# Patient Record
Sex: Female | Born: 1985 | Race: White | Hispanic: No | Marital: Married | Smoking: Never smoker
Health system: Southern US, Community
[De-identification: ages and names within clinical notes are randomized; demographics above are authoritative.]

## PROBLEM LIST (undated history)

## (undated) DIAGNOSIS — D649 Anemia, unspecified: Secondary | ICD-10-CM

## (undated) DIAGNOSIS — F32A Depression, unspecified: Secondary | ICD-10-CM

## (undated) DIAGNOSIS — E039 Hypothyroidism, unspecified: Secondary | ICD-10-CM

---

## 2017-09-26 ENCOUNTER — Ambulatory Visit: Payer: Self-pay | Admitting: Physician Assistant

## 2019-04-05 NOTE — L&D Delivery Note (Signed)
Operative Delivery Note At 7:07 PM a viable and healthy female was delivered via Vaginal, [employer], operational).  Presentation: vertex; Position: Left,, Occiput,, Anterior; Station: +4.  Verbal consent: obtained from patient.  Risks and benefits discussed in detail.  Risks include, but are not limited to the risks of anesthesia, bleeding, infection, damage to maternal tissues, fetal cephalhematoma.  There is also the risk of inability to effect vaginal delivery of the head, or shoulder dystocia that cannot be resolved by established maneuvers, leading to the need for emergency cesarean section.  APGAR: 7, 8; weight  pending.   Placenta status:spontaneous ,intact .   Cord:  with the following complications:  none.  Cord pH: na  Anesthesia:  epidural Instruments: kiwi x 2 pulls Episiotomy: None Lacerations: 2nd degree Suture Repair: 2.0 vicryl rapide Est. Blood Loss (mL):  400  Mom to postpartum.  Baby to Couplet care / Skin to Skin.  Anzal Bartnick J 03/05/2020, 7:22 PM

## 2019-06-09 ENCOUNTER — Ambulatory Visit: Payer: Managed Care, Other (non HMO) | Attending: Internal Medicine

## 2019-06-09 DIAGNOSIS — Z23 Encounter for immunization: Secondary | ICD-10-CM | POA: Insufficient documentation

## 2019-06-09 NOTE — Progress Notes (Signed)
   Covid-19 Vaccination Clinic  Name:  Brandy Glover    MRN: [id number] DOB: 1986-02-27  06/09/2019  [name] was observed post Covid-19 immunization for 15 minutes without incident. She was provided with Vaccine Information Sheet and instruction to access the V-Safe system.   [name] was instructed to call 911 with any severe reactions post vaccine: Marland Kitchen Difficulty breathing  . Swelling of face and throat  . A fast heartbeat  . A bad rash all over body  . Dizziness and weakness   Immunizations Administered    Name Date Dose VIS Date Route   Pfizer COVID-19 Vaccine 06/09/2019  3:24 PM 0.3 mL 03/15/2019 Intramuscular   Manufacturer: [employer], [employer]   Lot: [id number]   NDC: [address]-6

## 2019-07-10 ENCOUNTER — Ambulatory Visit: Payer: Managed Care, Other (non HMO) | Attending: Internal Medicine

## 2019-07-10 DIAGNOSIS — Z23 Encounter for immunization: Secondary | ICD-10-CM

## 2019-07-10 NOTE — Progress Notes (Signed)
   Covid-19 Vaccination Clinic  Name:  Brandy Glover    MRN: [id number] DOB: 1986-02-28  07/10/2019  Ms. Trim was observed post Covid-19 immunization for 15 minutes without incident. She was provided with Vaccine Information Sheet and instruction to access the V-Safe system.   [name] was instructed to call 911 with any severe reactions post vaccine: Marland Kitchen Difficulty breathing  . Swelling of face and throat  . A fast heartbeat  . A bad rash all over body  . Dizziness and weakness   Immunizations Administered    Name Date Dose VIS Date Route   Pfizer COVID-19 Vaccine 07/10/2019  8:52 AM 0.3 mL 03/15/2019 Intramuscular   Manufacturer: [employer], [employer]   Lot: [id number]   NDC: [address]-8

## 2019-08-13 LAB — OB RESULTS CONSOLE ANTIBODY SCREEN: Antibody Screen: NEGATIVE

## 2019-08-13 LAB — OB RESULTS CONSOLE ABO/RH: RH Type: POSITIVE

## 2019-08-13 LAB — OB RESULTS CONSOLE HIV ANTIBODY (ROUTINE TESTING): HIV: NONREACTIVE

## 2019-08-13 LAB — OB RESULTS CONSOLE RPR: RPR: NONREACTIVE

## 2019-08-13 LAB — OB RESULTS CONSOLE RUBELLA ANTIBODY, IGM: Rubella: IMMUNE

## 2019-08-13 LAB — OB RESULTS CONSOLE HEPATITIS B SURFACE ANTIGEN: Hepatitis B Surface Ag: NEGATIVE

## 2020-02-05 LAB — OB RESULTS CONSOLE GBS: GBS: NEGATIVE

## 2020-02-08 ENCOUNTER — Inpatient Hospital Stay (HOSPITAL_BASED_OUTPATIENT_CLINIC_OR_DEPARTMENT_OTHER): Payer: PRIVATE HEALTH INSURANCE

## 2020-02-08 ENCOUNTER — Other Ambulatory Visit: Payer: Self-pay

## 2020-02-08 ENCOUNTER — Inpatient Hospital Stay (HOSPITAL_COMMUNITY)
Admission: AD | Admit: 2020-02-08 | Discharge: 2020-02-08 | Disposition: A | Discharge status: Home / Self Care | Payer: PRIVATE HEALTH INSURANCE | Source: Ambulatory Visit | Attending: Obstetrics and Gynecology | Admitting: Obstetrics and Gynecology

## 2020-02-08 ENCOUNTER — Encounter (HOSPITAL_COMMUNITY): Payer: Self-pay | Admitting: Obstetrics and Gynecology

## 2020-02-08 DIAGNOSIS — O9A213 Injury, poisoning and certain other consequences of external causes complicating pregnancy, third trimester: Secondary | ICD-10-CM | POA: Diagnosis not present

## 2020-02-08 DIAGNOSIS — Z3A35 35 weeks gestation of pregnancy: Secondary | ICD-10-CM | POA: Insufficient documentation

## 2020-02-08 DIAGNOSIS — W19XXXA Unspecified fall, initial encounter: Secondary | ICD-10-CM

## 2020-02-08 DIAGNOSIS — O99891 Other specified diseases and conditions complicating pregnancy: Secondary | ICD-10-CM

## 2020-02-08 DIAGNOSIS — E039 Hypothyroidism, unspecified: Secondary | ICD-10-CM

## 2020-02-08 DIAGNOSIS — D649 Anemia, unspecified: Secondary | ICD-10-CM

## 2020-02-08 DIAGNOSIS — S82899A Other fracture of unspecified lower leg, initial encounter for closed fracture: Secondary | ICD-10-CM

## 2020-02-08 DIAGNOSIS — S82892D Other fracture of left lower leg, subsequent encounter for closed fracture with routine healing: Secondary | ICD-10-CM | POA: Insufficient documentation

## 2020-02-08 DIAGNOSIS — Z043 Encounter for examination and observation following other accident: Secondary | ICD-10-CM | POA: Diagnosis present

## 2020-02-08 DIAGNOSIS — O99283 Endocrine, nutritional and metabolic diseases complicating pregnancy, third trimester: Secondary | ICD-10-CM | POA: Diagnosis not present

## 2020-02-08 DIAGNOSIS — O99013 Anemia complicating pregnancy, third trimester: Secondary | ICD-10-CM

## 2020-02-08 HISTORY — DX: Anemia, unspecified: D64.9

## 2020-02-08 HISTORY — DX: Depression, unspecified: F32.A

## 2020-02-08 HISTORY — DX: Hypothyroidism, unspecified: E03.9

## 2020-02-08 HISTORY — DX: Other fracture of unspecified lower leg, initial encounter for closed fracture: S82.899A
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
## 2020-02-08 NOTE — MAU Note (Signed)
Pt reports she braced her fall and denies hitting her abdomen or having strong jarring. Pt is 35.5 weeks with an EDC of 03/09/2020. Pt denies cramping or contractions. Denies SROM, vaginal bleeding or bloody show. Reports baby has been very active. Here for monitoring and surveillance.

## 2020-02-08 NOTE — MAU Provider Note (Signed)
History     CSN: [id number]  Arrival date and time: 02/08/20 1449   First Provider Initiated Contact with Patient 02/08/20 1527      Chief Complaint  Patient presents with  . Fall    came [facility] -s/p fall with Broken ankle. Left lower leg casted   HPI Brandy Glover is a 34 y.o. G1P0 at [redacted]w[redacted]d who presents for monitoring after a fall. She states she was getting out of her car and twisted her leg on a hill and fell. She states she guarded her abdomen and fell on her bottom. She states she did not have any direct trauma on her abdomen. She went to [facility] and was casted due to a broken ankle before coming to [facility]. She denies any pain in her abdomen, vaginal bleeding or leaking of fluid. She reports normal fetal movement. She denies any problems during the pregnancy before now.   OB History    Gravida  1   Para      Term      Preterm      AB      Living        SAB      TAB      Ectopic      Multiple      Live Births           Obstetric Comments  Marginal cord insertion        Past Medical History:  Diagnosis Date  . Anemia   . Broken ankle 02/08/2020   left side  . Depression    history of no meds since 2015  . Hypothyroidism    through college for 6 years-no meds since 2015    History reviewed. No pertinent surgical history.  Family History  Problem Relation Age of Onset  . Hypertension Father   . Cancer Maternal Aunt   . Cancer Maternal Grandmother     Social History   Tobacco Use  . Smoking status: Never Smoker  . Smokeless tobacco: Never Used  Substance Use Topics  . Alcohol use: Never  . Drug use: Never    Allergies:  Allergies  Allergen Reactions  . Codeine Nausea And Vomiting  . Dairycare [Lactase-Lactobacillus] Diarrhea    No medications prior to admission.    Review of Systems  Constitutional: Negative.  Negative for fatigue and fever.  HENT: Negative.   Respiratory: Negative.  Negative  for shortness of breath.   Cardiovascular: Negative.  Negative for chest pain.  Gastrointestinal: Negative.  Negative for abdominal pain, constipation, diarrhea, nausea and vomiting.  Genitourinary: Negative.  Negative for dysuria.  Musculoskeletal:       Leg pain  Neurological: Negative.  Negative for dizziness and headaches.   Physical Exam   Blood pressure 118/74, pulse 71, temperature 98 F (36.7 C), temperature source Oral, resp. rate 17, height 5' 6.5" (1.689 m), weight 81.2 kg, SpO2 100 %.  Physical Exam Vitals and nursing note reviewed.  Constitutional:      General: She is not in acute distress.    Appearance: She is well-developed.  HENT:     Head: Normocephalic.  Eyes:     Pupils: Pupils are equal, round, and reactive to light.  Cardiovascular:     Rate and Rhythm: Normal rate and regular rhythm.     Heart sounds: Normal heart sounds.  Pulmonary:     Effort: Pulmonary effort is normal. No respiratory distress.     Breath  sounds: Normal breath sounds.  Abdominal:     General: Bowel sounds are normal. There is no distension.     Palpations: Abdomen is soft.     Tenderness: There is no abdominal tenderness.  Musculoskeletal:       Legs:  Skin:    General: Skin is warm and dry.  Neurological:     Mental Status: She is alert and oriented to person, place, and time.  Psychiatric:        Behavior: Behavior normal.        Thought Content: Thought content normal.        Judgment: Judgment normal.     Dilation: 2 Effacement (%): 60 Exam by:: Rayfield Citizen CNM  Fetal Tracing:  Baseline: 135 Variability: moderate Accels: 15x15 Decels: none  Toco: 1-4   MAU Course  Procedures  MDM FHR monitoring reactive [place] MFM OB Limited- no signs of abruption at this time  Consulted with Dr. Debroah Loop due to frequent ctx post fall- recommends 24 hour observation  Dr. Billy Coast notified of patient arrival and recommendation for plan of care- Dr. Billy Coast states patient is very  reliable, lives close and has transportation. Due to no signs of abruption at this time, appointment on Wednesday in the office and ability to return as needed to the hospital, ok to discharge home with strict pain and bleeding precautions.   Assessment and Plan   1. Fall, initial encounter   2. [redacted] weeks gestation of pregnancy    -Discharge home in stable condition -Abdominal pain and vaginal bleeding precautions discussed -Patient advised to follow-up with OB as scheduled for prenatal care on Wednesday -Patient may return to MAU as needed or if her condition were to change or worsen   Rolm Bookbinder CNM 02/08/2020, 6:19 PM

## 2020-02-08 NOTE — Discharge Instructions (Signed)

## 2020-02-12 ENCOUNTER — Ambulatory Visit (INDEPENDENT_AMBULATORY_CARE_PROVIDER_SITE_OTHER): Payer: PRIVATE HEALTH INSURANCE | Admitting: Orthopaedic Surgery

## 2020-02-12 ENCOUNTER — Other Ambulatory Visit: Payer: Self-pay

## 2020-02-12 ENCOUNTER — Encounter: Payer: Self-pay | Admitting: Orthopaedic Surgery

## 2020-02-12 DIAGNOSIS — S82832A Other fracture of upper and lower end of left fibula, initial encounter for closed fracture: Secondary | ICD-10-CM

## 2020-02-12 NOTE — Progress Notes (Signed)
Office Visit Note   Patient: Brandy Glover           Date of Birth: [birth year]           MRN: [id number] Visit Date: 02/12/2020              Requested by: No referring provider defined for this encounter. PCP: Patient, No Pcp Per   Assessment & Plan: Visit Diagnoses:  1. Closed fracture of distal end of left fibula, unspecified fracture morphology, initial encounter     Plan: Return 5 weeks for cast off.  If she is delivered her baby we can repeat x-rays of her ankle 3 view.  If she has not delivered we will defer radiographs.  Follow-Up Instructions: Return in about 5 weeks (around 03/18/2020).   Orders:  No orders of the defined types were placed in this encounter.  No orders of the defined types were placed in this encounter.     Procedures: No procedures performed   Clinical Data: No additional findings.   Subjective: Chief Complaint  Patient presents with  . Left Ankle - Fracture    DOI 02/08/2020    HPI 34 year old female due for delivery in 4 weeks tripped on a curb fell injuring her left ankle with inability to ambulate and x-rays done at [facility] urgent [facility] showing lateral malleolar nondisplaced fracture.  She is placed in a splint has been using a knee roller.  She not taking pain medication due to third trimester pregnancy.  Review of Systems All other systems are noncontributory no past history of injury to her ankle.  She denies knee pain or other injuries with the fall.  Objective: Vital Signs: Ht 5' 6.5" (1.689 m)   Wt 179 lb (81.2 kg)   BMI 28.46 kg/m   Physical Exam Constitutional:      Appearance: She is well-developed.  HENT:     Head: Normocephalic.     Right Ear: External ear normal.     Left Ear: External ear normal.  Eyes:     Pupils: Pupils are equal, round, and reactive to light.  Neck:     Thyroid: No thyromegaly.     Trachea: No tracheal deviation.  Cardiovascular:     Rate and Rhythm: Normal rate.  Pulmonary:      Effort: Pulmonary effort is normal.  Abdominal:     Palpations: Abdomen is soft.     Comments: 3rd trimester pregnancy  Skin:    General: Skin is warm and dry.  Neurological:     Mental Status: She is alert and oriented to person, place, and time.  Psychiatric:        Behavior: Behavior normal.     Ortho Exam splint removed.  Short leg fiberglass cast applied.  No x-ray needed today.   Specialty Comments:  No specialty comments available.  Imaging: No results found.   PMFS History: Patient Active Problem List   Diagnosis Date Noted  . Closed fracture of left distal fibula 02/12/2020   Past Medical History:  Diagnosis Date  . Anemia   . Broken ankle 02/08/2020   left side  . Depression    history of no meds since 2015  . Hypothyroidism    through college for 6 years-no meds since 2015    Family History  Problem Relation Age of Onset  . Hypertension Father   . Cancer Maternal Aunt   . Cancer Maternal Grandmother     No past surgical history on file.  Social History   Occupational History  . Not on file  Tobacco Use  . Smoking status: Never Smoker  . Smokeless tobacco: Never Used  Substance and Sexual Activity  . Alcohol use: Never  . Drug use: Never  . Sexual activity: Yes

## 2020-02-26 ENCOUNTER — Telehealth (HOSPITAL_COMMUNITY): Payer: Self-pay | Admitting: *Deleted

## 2020-02-26 NOTE — Telephone Encounter (Signed)
Preadmission screen  

## 2020-03-03 ENCOUNTER — Other Ambulatory Visit (HOSPITAL_COMMUNITY): Payer: PRIVATE HEALTH INSURANCE

## 2020-03-03 ENCOUNTER — Telehealth (HOSPITAL_COMMUNITY): Payer: Self-pay | Admitting: *Deleted

## 2020-03-03 ENCOUNTER — Encounter (HOSPITAL_COMMUNITY): Payer: Self-pay | Admitting: *Deleted

## 2020-03-03 ENCOUNTER — Other Ambulatory Visit (HOSPITAL_COMMUNITY)
Admission: RE | Admit: 2020-03-03 | Discharge: 2020-03-03 | Disposition: A | Discharge status: Home / Self Care | Payer: PRIVATE HEALTH INSURANCE | Source: Ambulatory Visit | Attending: Obstetrics and Gynecology | Admitting: Obstetrics and Gynecology

## 2020-03-03 DIAGNOSIS — Z01812 Encounter for preprocedural laboratory examination: Secondary | ICD-10-CM | POA: Insufficient documentation

## 2020-03-03 DIAGNOSIS — Z20822 Contact with and (suspected) exposure to covid-19: Secondary | ICD-10-CM | POA: Insufficient documentation

## 2020-03-03 LAB — SARS CORONAVIRUS 2 (TAT 6-24 HRS): SARS Coronavirus 2: NEGATIVE

## 2020-03-03 NOTE — Telephone Encounter (Signed)
Preadmission screen  

## 2020-03-05 ENCOUNTER — Other Ambulatory Visit: Payer: Self-pay

## 2020-03-05 ENCOUNTER — Encounter (HOSPITAL_COMMUNITY): Payer: Self-pay | Admitting: Obstetrics and Gynecology

## 2020-03-05 ENCOUNTER — Inpatient Hospital Stay (HOSPITAL_COMMUNITY): Payer: PRIVATE HEALTH INSURANCE | Admitting: Anesthesiology

## 2020-03-05 ENCOUNTER — Inpatient Hospital Stay (HOSPITAL_COMMUNITY): Payer: PRIVATE HEALTH INSURANCE

## 2020-03-05 ENCOUNTER — Inpatient Hospital Stay (HOSPITAL_COMMUNITY)
Admission: AD | Admit: 2020-03-05 | Discharge: 2020-03-07 | DRG: 807 | Disposition: A | Discharge status: Home / Self Care | Payer: PRIVATE HEALTH INSURANCE | Attending: Obstetrics and Gynecology | Admitting: Obstetrics and Gynecology

## 2020-03-05 ENCOUNTER — Other Ambulatory Visit: Payer: Self-pay | Admitting: Obstetrics and Gynecology

## 2020-03-05 DIAGNOSIS — Z20822 Contact with and (suspected) exposure to covid-19: Secondary | ICD-10-CM | POA: Diagnosis present

## 2020-03-05 DIAGNOSIS — Z3A39 39 weeks gestation of pregnancy: Secondary | ICD-10-CM

## 2020-03-05 DIAGNOSIS — O43123 Velamentous insertion of umbilical cord, third trimester: Secondary | ICD-10-CM | POA: Diagnosis present

## 2020-03-05 DIAGNOSIS — O134 Gestational [pregnancy-induced] hypertension without significant proteinuria, complicating childbirth: Secondary | ICD-10-CM | POA: Diagnosis present

## 2020-03-05 DIAGNOSIS — Z3490 Encounter for supervision of normal pregnancy, unspecified, unspecified trimester: Secondary | ICD-10-CM | POA: Diagnosis present

## 2020-03-05 DIAGNOSIS — Z8781 Personal history of (healed) traumatic fracture: Secondary | ICD-10-CM | POA: Diagnosis not present

## 2020-03-05 DIAGNOSIS — Z23 Encounter for immunization: Secondary | ICD-10-CM

## 2020-03-05 DIAGNOSIS — O99893 Other specified diseases and conditions complicating puerperium: Secondary | ICD-10-CM | POA: Diagnosis not present

## 2020-03-05 DIAGNOSIS — S82832A Other fracture of upper and lower end of left fibula, initial encounter for closed fracture: Secondary | ICD-10-CM | POA: Diagnosis present

## 2020-03-05 DIAGNOSIS — O9902 Anemia complicating childbirth: Secondary | ICD-10-CM | POA: Diagnosis present

## 2020-03-05 DIAGNOSIS — O139 Gestational [pregnancy-induced] hypertension without significant proteinuria, unspecified trimester: Secondary | ICD-10-CM | POA: Diagnosis present

## 2020-03-05 DIAGNOSIS — R7989 Other specified abnormal findings of blood chemistry: Secondary | ICD-10-CM | POA: Diagnosis not present

## 2020-03-05 LAB — CBC
HCT: 35.8 % — ABNORMAL LOW (ref 36.0–46.0)
Hemoglobin: 12.2 g/dL (ref 12.0–15.0)
MCH: 30.2 pg (ref 26.0–34.0)
MCHC: 34.1 g/dL (ref 30.0–36.0)
MCV: 88.6 fL (ref 80.0–100.0)
Platelets: 174 10*3/uL (ref 150–400)
RBC: 4.04 MIL/uL (ref 3.87–5.11)
RDW: 12 % (ref 11.5–15.5)
WBC: 9.7 10*3/uL (ref 4.0–10.5)
nRBC: 0 % (ref 0.0–0.2)

## 2020-03-05 LAB — COMPREHENSIVE METABOLIC PANEL
ALT: 14 U/L (ref 0–44)
ALT: 14 U/L (ref 0–44)
AST: 25 U/L (ref 15–41)
AST: 31 U/L (ref 15–41)
Albumin: 2.7 g/dL — ABNORMAL LOW (ref 3.5–5.0)
Albumin: 2.4 g/dL — ABNORMAL LOW (ref 3.5–5.0)
Alkaline Phosphatase: 197 U/L — ABNORMAL HIGH (ref 38–126)
Alkaline Phosphatase: 176 U/L — ABNORMAL HIGH (ref 38–126)
Anion gap: 10 (ref 5–15)
Anion gap: 11 (ref 5–15)
BUN: 11 mg/dL (ref 6–20)
BUN: 15 mg/dL (ref 6–20)
CO2: 20 mmol/L — ABNORMAL LOW (ref 22–32)
CO2: 20 mmol/L — ABNORMAL LOW (ref 22–32)
Calcium: 8.8 mg/dL — ABNORMAL LOW (ref 8.9–10.3)
Calcium: 8.4 mg/dL — ABNORMAL LOW (ref 8.9–10.3)
Chloride: 106 mmol/L (ref 98–111)
Chloride: 104 mmol/L (ref 98–111)
Creatinine, Ser: 1.04 mg/dL — ABNORMAL HIGH (ref 0.44–1.00)
Creatinine, Ser: 1.3 mg/dL — ABNORMAL HIGH (ref 0.44–1.00)
GFR, Estimated: >60 mL/min (ref >60)
GFR, Estimated: 56 mL/min — ABNORMAL LOW (ref >60)
Glucose, Bld: 114 mg/dL — ABNORMAL HIGH (ref 70–99)
Glucose, Bld: 94 mg/dL (ref 70–99)
Potassium: 3.5 mmol/L (ref 3.5–5.1)
Potassium: 4.3 mmol/L (ref 3.5–5.1)
Sodium: 136 mmol/L (ref 135–145)
Sodium: 135 mmol/L (ref 135–145)
Total Bilirubin: 0.3 mg/dL (ref 0.3–1.2)
Total Bilirubin: 0.5 mg/dL (ref 0.3–1.2)
Total Protein: 6 g/dL — ABNORMAL LOW (ref 6.5–8.1)
Total Protein: 5.3 g/dL — ABNORMAL LOW (ref 6.5–8.1)

## 2020-03-05 LAB — TYPE AND SCREEN
ABO/RH(D): O POS
Antibody Screen: NEGATIVE

## 2020-03-05 LAB — PROTEIN / CREATININE RATIO, URINE
Creatinine, Urine: 144.21 mg/dL
Protein Creatinine Ratio: 0.29 mg/mg{Cre} — ABNORMAL HIGH (ref 0.00–0.15)
Total Protein, Urine: 42 mg/dL

## 2020-03-05 LAB — RPR: RPR Ser Ql: NONREACTIVE

## 2020-03-05 MED ORDER — LIDOCAINE HCL (PF) 1 % IJ SOLN: 30.0000 mL | INTRAMUSCULAR | Status: DC | PRN

## 2020-03-05 MED ORDER — SODIUM BICARBONATE 8.4 % IV SOLN: INTRAVENOUS | Status: DC | PRN

## 2020-03-05 MED ORDER — FENTANYL CITRATE (PF) 100 MCG/2ML IJ SOLN
100.0000 ug | INTRAMUSCULAR | Status: DC | PRN
  Administered 2020-03-05: 100 ug via INTRAVENOUS
  Filled 2020-03-05 (×4): qty 2

## 2020-03-05 MED ORDER — LACTATED RINGERS IV SOLN: INTRAVENOUS | Status: DC

## 2020-03-05 MED ORDER — LACTATED RINGERS IV SOLN
500.0000 mL | INTRAVENOUS | Status: DC | PRN
  Administered 2020-03-05: 500 mL via INTRAVENOUS

## 2020-03-05 MED ORDER — BENZOCAINE-MENTHOL 20-0.5 % EX AERO
1.0000 "application " | INHALATION_SPRAY | CUTANEOUS | Status: DC | PRN
  Administered 2020-03-06: 1 via TOPICAL
  Filled 2020-03-05 (×2): qty 56

## 2020-03-05 MED ORDER — BUPIVACAINE IN DEXTROSE 0.75-8.25 % IT SOLN
INTRATHECAL | Status: AC
  Filled 2020-03-05: qty 2

## 2020-03-05 MED ORDER — PHENYLEPHRINE 40 MCG/ML (10ML) SYRINGE FOR IV PUSH (FOR BLOOD PRESSURE SUPPORT): 80.0000 ug | PREFILLED_SYRINGE | INTRAVENOUS | Status: DC | PRN

## 2020-03-05 MED ORDER — OXYTOCIN-SODIUM CHLORIDE 30-0.9 UT/500ML-% IV SOLN: 1.0000 m[IU]/min | INTRAVENOUS | Status: DC

## 2020-03-05 MED ORDER — IBUPROFEN 600 MG PO TABS
600.0000 mg | ORAL_TABLET | Freq: Four times a day (QID) | ORAL | Status: DC
  Administered 2020-03-06 – 2020-03-07 (×8): 600 mg via ORAL
  Filled 2020-03-05 (×8): qty 1

## 2020-03-05 MED ORDER — OXYCODONE-ACETAMINOPHEN 5-325 MG PO TABS: 1.0000 | ORAL_TABLET | ORAL | Status: DC | PRN

## 2020-03-05 MED ORDER — ONDANSETRON HCL 4 MG/2ML IJ SOLN: 4.0000 mg | INTRAMUSCULAR | Status: DC | PRN

## 2020-03-05 MED ORDER — TETANUS-DIPHTH-ACELL PERTUSSIS 5-2.5-18.5 LF-MCG/0.5 IM SUSY: 0.5000 mL | PREFILLED_SYRINGE | Freq: Once | INTRAMUSCULAR | Status: DC

## 2020-03-05 MED ORDER — TRANEXAMIC ACID-NACL 1000-0.7 MG/100ML-% IV SOLN
INTRAVENOUS | Status: AC
  Administered 2020-03-05: 1000 mg
  Filled 2020-03-05: qty 100

## 2020-03-05 MED ORDER — LIDOCAINE-EPINEPHRINE (PF) 2 %-1:200000 IJ SOLN
INTRAMUSCULAR | Status: DC | PRN
  Administered 2020-03-05: 5 mL via EPIDURAL

## 2020-03-05 MED ORDER — WITCH HAZEL-GLYCERIN EX PADS: 1.0000 "application " | MEDICATED_PAD | CUTANEOUS | Status: DC | PRN

## 2020-03-05 MED ORDER — DIPHENHYDRAMINE HCL 50 MG/ML IJ SOLN: 12.5000 mg | INTRAMUSCULAR | Status: DC | PRN

## 2020-03-05 MED ORDER — DIPHENHYDRAMINE HCL 25 MG PO CAPS: 25.0000 mg | ORAL_CAPSULE | Freq: Four times a day (QID) | ORAL | Status: DC | PRN

## 2020-03-05 MED ORDER — FENTANYL CITRATE (PF) 100 MCG/2ML IJ SOLN
INTRAMUSCULAR | Status: DC | PRN
Start: 2020-03-05 — End: 2020-03-05
  Administered 2020-03-05: 100 ug via EPIDURAL

## 2020-03-05 MED ORDER — ONDANSETRON HCL 4 MG/2ML IJ SOLN
4.0000 mg | Freq: Four times a day (QID) | INTRAMUSCULAR | Status: DC | PRN
  Administered 2020-03-05: 4 mg via INTRAVENOUS
  Filled 2020-03-05: qty 2

## 2020-03-05 MED ORDER — METHYLERGONOVINE MALEATE 0.2 MG PO TABS: 0.2000 mg | ORAL_TABLET | ORAL | Status: DC | PRN

## 2020-03-05 MED ORDER — SOD CITRATE-CITRIC ACID 500-334 MG/5ML PO SOLN: 30.0000 mL | ORAL | Status: DC | PRN

## 2020-03-05 MED ORDER — SODIUM CHLORIDE (PF) 0.9 % IJ SOLN
INTRAMUSCULAR | Status: DC | PRN
  Administered 2020-03-05: 12 mL/h via EPIDURAL

## 2020-03-05 MED ORDER — OXYCODONE-ACETAMINOPHEN 5-325 MG PO TABS: 2.0000 | ORAL_TABLET | ORAL | Status: DC | PRN

## 2020-03-05 MED ORDER — ZOLPIDEM TARTRATE 5 MG PO TABS: 5.0000 mg | ORAL_TABLET | Freq: Every evening | ORAL | Status: DC | PRN

## 2020-03-05 MED ORDER — OXYTOCIN-SODIUM CHLORIDE 30-0.9 UT/500ML-% IV SOLN: 2.5000 [IU]/h | INTRAVENOUS | Status: DC

## 2020-03-05 MED ORDER — FENTANYL-BUPIVACAINE-NACL 0.5-0.125-0.9 MG/250ML-% EP SOLN
12.0000 mL/h | EPIDURAL | Status: DC | PRN
  Filled 2020-03-05: qty 250

## 2020-03-05 MED ORDER — EPHEDRINE 5 MG/ML INJ: 10.0000 mg | INTRAVENOUS | Status: DC | PRN

## 2020-03-05 MED ORDER — OXYTOCIN-SODIUM CHLORIDE 30-0.9 UT/500ML-% IV SOLN
2.5000 [IU]/h | INTRAVENOUS | Status: DC
  Administered 2020-03-05: 2.5 [IU]/h via INTRAVENOUS

## 2020-03-05 MED ORDER — METHYLERGONOVINE MALEATE 0.2 MG/ML IJ SOLN: 0.2000 mg | INTRAMUSCULAR | Status: DC | PRN

## 2020-03-05 MED ORDER — ACETAMINOPHEN 325 MG PO TABS: 650.0000 mg | ORAL_TABLET | ORAL | Status: DC | PRN

## 2020-03-05 MED ORDER — PRENATAL MULTIVITAMIN CH
1.0000 | ORAL_TABLET | Freq: Every day | ORAL | Status: DC
  Administered 2020-03-06 – 2020-03-07 (×2): 1 via ORAL
  Filled 2020-03-05 (×2): qty 1

## 2020-03-05 MED ORDER — LACTATED RINGERS IV SOLN: 500.0000 mL | INTRAVENOUS | Status: DC | PRN

## 2020-03-05 MED ORDER — ONDANSETRON HCL 4 MG/2ML IJ SOLN: 4.0000 mg | Freq: Four times a day (QID) | INTRAMUSCULAR | Status: DC | PRN

## 2020-03-05 MED ORDER — ONDANSETRON HCL 4 MG PO TABS: 4.0000 mg | ORAL_TABLET | ORAL | Status: DC | PRN

## 2020-03-05 MED ORDER — SIMETHICONE 80 MG PO CHEW: 80.0000 mg | CHEWABLE_TABLET | ORAL | Status: DC | PRN

## 2020-03-05 MED ORDER — COCONUT OIL OIL: 1.0000 "application " | TOPICAL_OIL | Status: DC | PRN

## 2020-03-05 MED ORDER — FENTANYL CITRATE (PF) 100 MCG/2ML IJ SOLN
INTRAMUSCULAR | Status: DC | PRN
  Administered 2020-03-05: 100 ug via EPIDURAL

## 2020-03-05 MED ORDER — ACETAMINOPHEN 500 MG PO TABS
1000.0000 mg | ORAL_TABLET | Freq: Four times a day (QID) | ORAL | Status: DC | PRN
  Administered 2020-03-05: 1000 mg via ORAL
  Filled 2020-03-05: qty 2

## 2020-03-05 MED ORDER — LACTATED RINGERS IV SOLN
500.0000 mL | Freq: Once | INTRAVENOUS | Status: AC
  Administered 2020-03-05: 500 mL via INTRAVENOUS

## 2020-03-05 MED ORDER — TERBUTALINE SULFATE 1 MG/ML IJ SOLN: 0.2500 mg | Freq: Once | INTRAMUSCULAR | Status: DC | PRN

## 2020-03-05 MED ORDER — OXYTOCIN BOLUS FROM INFUSION: 333.0000 mL | Freq: Once | INTRAVENOUS | Status: DC

## 2020-03-05 MED ORDER — DIBUCAINE (PERIANAL) 1 % EX OINT: 1.0000 "application " | TOPICAL_OINTMENT | CUTANEOUS | Status: DC | PRN

## 2020-03-05 MED ORDER — BUPIVACAINE HCL (PF) 0.25 % IJ SOLN
INTRAMUSCULAR | Status: DC | PRN
  Administered 2020-03-05: 10 mL via EPIDURAL
  Administered 2020-03-05: 5 mL via EPIDURAL
  Administered 2020-03-05: 10 mL via EPIDURAL
  Administered 2020-03-05: 5 mL via EPIDURAL

## 2020-03-05 MED ORDER — OXYTOCIN BOLUS FROM INFUSION
333.0000 mL | Freq: Once | INTRAVENOUS | Status: DC
  Administered 2020-03-05: 333 mL via INTRAVENOUS

## 2020-03-05 MED ORDER — SENNOSIDES-DOCUSATE SODIUM 8.6-50 MG PO TABS
2.0000 | ORAL_TABLET | ORAL | Status: DC
  Administered 2020-03-06 (×2): 2 via ORAL
  Filled 2020-03-05 (×2): qty 2

## 2020-03-05 MED ORDER — OXYTOCIN-SODIUM CHLORIDE 30-0.9 UT/500ML-% IV SOLN
1.0000 m[IU]/min | INTRAVENOUS | Status: DC
  Administered 2020-03-05: 2 m[IU]/min via INTRAVENOUS
  Filled 2020-03-05: qty 500

## 2020-03-05 MED ORDER — LIDOCAINE HCL (PF) 1 % IJ SOLN
INTRAMUSCULAR | Status: DC | PRN
  Administered 2020-03-05 (×2): 5 mL via EPIDURAL

## 2020-03-05 NOTE — Progress Notes (Signed)
Brandy Glover is a 34 y.o. G1P0 at [redacted]w[redacted]d by LMP admitted for induction of labor due to Hypertension.  Subjective: Comfortable  Objective: BP 125/84   Pulse 76   Temp 97.9 F (36.6 C) (Oral)   Resp 16   Ht 5' 6.5" (1.689 m)   Wt 84.1 kg   SpO2 98%   BMI 29.48 kg/m  No intake/output data recorded. No intake/output data recorded.  FHT:  FHR: 145 bpm, variability: moderate,  accelerations:  Present,  decelerations:  Absent UC:   regular, every 2-3 minutes SVE:   Dilation: 6.5 Effacement (%): 100 Station: 0 Exam by:: Dr Billy Coast  IUPC placed without difficulty  Labs: Lab Results  Component Value Date   WBC 9.7 03/05/2020   HGB 12.2 03/05/2020   HCT 35.8 (L) 03/05/2020   MCV 88.6 03/05/2020   PLT 174 03/05/2020    Assessment / Plan: Induction of labor due to gestational hypertension,  progressing well on pitocin  Labor: Progressing normally Preeclampsia:  no signs or symptoms of toxicity Fetal Wellbeing:  Category I Pain Control:  epidural I/D:  n/a Anticipated MOD:  NSVD  Brandy Glover J 03/05/2020, 2:06 PM

## 2020-03-05 NOTE — H&P (Signed)
Brandy Glover is a 34 y.o. female presenting for IOL for gest htn. OB History    Gravida  1   Para      Term      Preterm      AB      Living        SAB      TAB      Ectopic      Multiple      Live Births           Obstetric Comments  Marginal cord insertion       Past Medical History:  Diagnosis Date  . Anemia   . Broken ankle 02/08/2020   left side  . Depression    history of no meds since 2015  . Hypothyroidism    through college for 6 years-no meds since 2015   History reviewed. No pertinent surgical history. Family History: family history includes Cancer in her maternal aunt and maternal grandmother; Hypertension in her father; Parkinson's disease in her paternal grandfather. Social History:  reports that she has never smoked. She has never used smokeless tobacco. She reports that she does not drink alcohol and does not use drugs.     Maternal Diabetes: No Genetic Screening: Normal Maternal Ultrasounds/Referrals: Normal Fetal Ultrasounds or other Referrals:  None Maternal Substance Abuse:  No Significant Maternal Medications:  None Significant Maternal Lab Results:  Group B Strep negative Other Comments:  None  Review of Systems  Constitutional: Negative.   All other systems reviewed and are negative.  Maternal Medical History:  Reason for admission: Contractions.   Contractions: Onset was less than 1 hour ago.   Frequency: rare.   Perceived severity is mild.    Fetal activity: Perceived fetal activity is normal.   Last perceived fetal movement was within the past hour.    Prenatal complications: PIH.   Prenatal Complications - Diabetes: none.    Dilation: 3 Effacement (%): 70 Station: -2 Exam by:: Dr Brandy Glover Height 5' 6.5" (1.689 m), weight 84.1 kg. Maternal Exam:  Uterine Assessment: Contraction strength is mild.  Contraction frequency is irregular.   Abdomen: Fetal presentation: vertex  Introitus: Normal vulva.  Normal vagina.  Ferning test: positive.  Nitrazine test: positive. Amniotic fluid character: clear.  Pelvis: adequate for delivery.   Cervix: Cervix evaluated by digital exam.     Physical Exam Vitals and nursing note reviewed. Exam conducted with a chaperone present.  Constitutional:      Appearance: Normal appearance.  HENT:     Head: Normocephalic and atraumatic.  Cardiovascular:     Rate and Rhythm: Normal rate and regular rhythm.     Pulses: Normal pulses.     Heart sounds: Normal heart sounds.  Pulmonary:     Effort: Pulmonary effort is normal.     Breath sounds: Normal breath sounds.  Abdominal:     Palpations: Abdomen is soft.  Genitourinary:    General: Normal vulva.  Musculoskeletal:        General: Normal range of motion.     Cervical back: Normal range of motion and neck supple.  Skin:    General: Skin is warm and dry.  Neurological:     General: No focal deficit present.     Mental Status: She is alert and oriented to person, place, and time.  Psychiatric:        Mood and Affect: Mood normal.        Behavior: Behavior normal.  Prenatal labs: ABO, Rh: --/--/PENDING (12/02 9758) Antibody: PENDING (12/02 0755) Rubella: Immune (05/11 0000) RPR: Nonreactive (05/11 0000)  HBsAg: Negative (05/11 0000)  HIV: Non-reactive (05/11 0000)  GBS:     Assessment/Plan: 39 wk IUP New onset Gest HTN IOL Ck labs ( nl on 11/30, neg PCR)   Brandy Glover J 03/05/2020, 8:18 AM

## 2020-03-05 NOTE — Lactation Note (Signed)
This note was copied from a baby's chart. Lactation Consultation Note  Patient Name: Brandy Glover [id number] Date: 03/05/2020 Reason for consult: Initial assessment  Initial visit to 4 hours old infant of a P1 mother. Mother states infant latched very well after delivery. Mother reports skin to skin for more than 60 minutes. Mother denies pain with latching. Talked to mother about hand expression and demonstrated technique. Colostrum easily expressed. FOB is present and supportive.    Plan: 1-Breastfeeding on demand, ensuring a deep, comfortable latch.  2-Offer breast 8-12 times in 24h period to establish good milk supply. 3-Undressing infant and place skin to skin when ready to breastfeed 4-Keep infant awake during breastfeeding session: massaging breast, infant's hand/shoulder/feet 5-Monitor voids and stools as signs good intake.  6-Encouraged maternal rest, hydration and food intake.  7-Contact LC as needed for feeds/support/concerns/questions   All questions answered at this time. Provided Lactation services brochure.   Maternal Data Formula Feeding for Exclusion: No Has patient been taught Hand Expression?: Yes Does the patient have breastfeeding experience prior to this delivery?: No  Feeding Feeding Type: Breast Fed  LATCH Score Latch: Grasps breast easily, tongue down, lips flanged, rhythmical sucking.  Audible Swallowing: A few with stimulation  Type of Nipple: Everted at rest and after stimulation  Comfort (Breast/Nipple): Soft / non-tender  Hold (Positioning): Assistance needed to correctly position infant at breast and maintain latch.  LATCH Score: 8  Interventions Interventions: Breast feeding basics reviewed;Expressed milk;DEBP;Hand express;Breast massage;Skin to skin  Lactation Tools Discussed/Used WIC Program: No Pump Review: Milk Storage   Consult Status Consult Status: Follow-up Date: 03/06/20 Follow-up type: In-patient    Remas Sobel A Higuera  Ancidey 03/05/2020, 11:16 PM

## 2020-03-05 NOTE — Anesthesia Preprocedure Evaluation (Signed)
Anesthesia Evaluation  Patient identified by MRN, date of birth, ID band Patient awake    Reviewed: Allergy & Precautions, H&P , NPO status , Patient's Chart, lab work & pertinent test results  History of Anesthesia Complications Negative for: history of anesthetic complications  Airway Mallampati: II  TM Distance: >3 FB Neck ROM: full    Dental no notable dental hx. (+) Teeth Intact   Pulmonary neg pulmonary ROS,    Pulmonary exam normal breath sounds clear to auscultation       Cardiovascular negative cardio ROS Normal cardiovascular exam Rhythm:regular Rate:Normal     Neuro/Psych PSYCHIATRIC DISORDERS Depression negative neurological ROS     GI/Hepatic negative GI ROS, Neg liver ROS,   Endo/Other  Hypothyroidism   Renal/GU negative Renal ROS  negative genitourinary   Musculoskeletal   Abdominal   Peds  Hematology negative hematology ROS (+)   Anesthesia Other Findings   Reproductive/Obstetrics (+) Pregnancy                             Anesthesia Physical Anesthesia Plan  ASA: II  Anesthesia Plan: Epidural   Post-op Pain Management:    Induction:   PONV Risk Score and Plan:   Airway Management Planned:   Additional Equipment:   Intra-op Plan:   Post-operative Plan:   Informed Consent: I have reviewed the patients History and Physical, chart, labs and discussed the procedure including the risks, benefits and alternatives for the proposed anesthesia with the patient or authorized representative who has indicated his/her understanding and acceptance.       Plan Discussed with:   Anesthesia Plan Comments:         Anesthesia Quick Evaluation

## 2020-03-05 NOTE — Anesthesia Procedure Notes (Signed)
Epidural Patient location during procedure: OB Start time: 03/05/2020 9:18 AM End time: 03/05/2020 9:28 AM  Staffing Anesthesiologist: Leonides Grills, MD Performed: anesthesiologist   Preanesthetic Checklist Completed: patient identified, IV checked, site marked, risks and benefits discussed, monitors and equipment checked, pre-op evaluation and timeout performed  Epidural Patient position: sitting Prep: DuraPrep Patient monitoring: heart rate, cardiac monitor, continuous pulse ox and blood pressure Approach: midline Location: L4-L5 Injection technique: LOR air  Needle:  Needle type: Tuohy  Needle gauge: 17 G Needle length: 9 cm Needle insertion depth: 5 cm Catheter type: closed end flexible Catheter size: 19 Gauge Catheter at skin depth: 10 cm Test dose: negative and Other  Assessment Events: blood not aspirated, injection not painful, no injection resistance and negative IV test  Additional Notes Informed consent obtained prior to proceeding including risk of failure, 1% risk of PDPH, risk of minor discomfort and bruising.  Discussed alternatives to epidural analgesia and patient desires to proceed.  Timeout performed pre-procedure verifying patient name, procedure, and platelet count.  Patient tolerated procedure well. Reason for block:procedure for pain

## 2020-03-06 DIAGNOSIS — O139 Gestational [pregnancy-induced] hypertension without significant proteinuria, unspecified trimester: Secondary | ICD-10-CM | POA: Diagnosis present

## 2020-03-06 DIAGNOSIS — O9902 Anemia complicating childbirth: Secondary | ICD-10-CM | POA: Diagnosis not present

## 2020-03-06 DIAGNOSIS — R7989 Other specified abnormal findings of blood chemistry: Secondary | ICD-10-CM | POA: Diagnosis present

## 2020-03-06 LAB — CBC
HCT: 25.5 % — ABNORMAL LOW (ref 36.0–46.0)
Hemoglobin: 9 g/dL — ABNORMAL LOW (ref 12.0–15.0)
MCH: 30.8 pg (ref 26.0–34.0)
MCHC: 35.3 g/dL (ref 30.0–36.0)
MCV: 87.3 fL (ref 80.0–100.0)
Platelets: 128 10*3/uL — ABNORMAL LOW (ref 150–400)
RBC: 2.92 MIL/uL — ABNORMAL LOW (ref 3.87–5.11)
RDW: 12.1 % (ref 11.5–15.5)
WBC: 19.5 10*3/uL — ABNORMAL HIGH (ref 4.0–10.5)
nRBC: 0 % (ref 0.0–0.2)

## 2020-03-06 LAB — COMPREHENSIVE METABOLIC PANEL
ALT: 18 U/L (ref 0–44)
AST: 48 U/L — ABNORMAL HIGH (ref 15–41)
Albumin: 2.2 g/dL — ABNORMAL LOW (ref 3.5–5.0)
Alkaline Phosphatase: 140 U/L — ABNORMAL HIGH (ref 38–126)
Anion gap: 8 (ref 5–15)
BUN: 13 mg/dL (ref 6–20)
CO2: 24 mmol/L (ref 22–32)
Calcium: 8.6 mg/dL — ABNORMAL LOW (ref 8.9–10.3)
Chloride: 105 mmol/L (ref 98–111)
Creatinine, Ser: 1.23 mg/dL — ABNORMAL HIGH (ref 0.44–1.00)
GFR, Estimated: 60 mL/min — ABNORMAL LOW (ref >60)
Glucose, Bld: 84 mg/dL (ref 70–99)
Potassium: 4.4 mmol/L (ref 3.5–5.1)
Sodium: 137 mmol/L (ref 135–145)
Total Bilirubin: 0.5 mg/dL (ref 0.3–1.2)
Total Protein: 5 g/dL — ABNORMAL LOW (ref 6.5–8.1)

## 2020-03-06 MED ORDER — MAGNESIUM OXIDE 400 (241.3 MG) MG PO TABS
400.0000 mg | ORAL_TABLET | Freq: Every day | ORAL | Status: DC
  Administered 2020-03-06 – 2020-03-07 (×2): 400 mg via ORAL
  Filled 2020-03-06 (×2): qty 1

## 2020-03-06 MED ORDER — POLYSACCHARIDE IRON COMPLEX 150 MG PO CAPS
150.0000 mg | ORAL_CAPSULE | Freq: Every day | ORAL | Status: DC
  Administered 2020-03-06 – 2020-03-07 (×2): 150 mg via ORAL
  Filled 2020-03-06 (×2): qty 1

## 2020-03-06 MED ORDER — INFLUENZA VAC SPLIT QUAD 0.5 ML IM SUSY
0.5000 mL | PREFILLED_SYRINGE | INTRAMUSCULAR | Status: AC
  Administered 2020-03-07: 0.5 mL via INTRAMUSCULAR
  Filled 2020-03-06: qty 0.5

## 2020-03-06 MED ORDER — NIFEDIPINE ER OSMOTIC RELEASE 30 MG PO TB24
30.0000 mg | ORAL_TABLET | Freq: Every day | ORAL | Status: DC
  Administered 2020-03-06 – 2020-03-07 (×2): 30 mg via ORAL
  Filled 2020-03-06 (×2): qty 1

## 2020-03-06 NOTE — Lactation Note (Signed)
This note was copied from a baby's chart. Lactation Consultation Note  Patient Name: Brandy Glover [id number] Date: 03/06/2020 P1, 27 hour term female infant -2% weight loss. Infant had 6 voids and 7 stools since birth. LC entered room infant in basinet with pacifier in mouth, LC discussed with parents to wait to offer pacifier until infant is 63 weeks of age. LC discussed pacifier may mask hunger cues, may cause  infant to have an disorganized suck and become nipple confuse, best to  wait until mom's milk supply is establish and infant is older.  Per mom, infant has been latching well, most feedings are 20 minutes in length, infant last BF 2220 for 24 minutes,LC did not observe latch at this time. Per mom, she only feels a tug when infant is latched  and not pain with latch. Mom will continue to breastfeed infant according to cues, 8 to 12+ times within 24 hours, STS. Mom knows to call RN or LC services if she has any questions, concerns or need assistance with latching infant at the breast.    Maternal Data    Feeding Feeding Type: Breast Fed  LATCH Score                   Interventions Interventions: Skin to skin;Breast compression  Lactation Tools Discussed/Used     Consult Status Consult Status: Follow-up Date: 03/07/20 Follow-up type: In-patient    Danelle Earthly 03/06/2020, 10:53 PM

## 2020-03-06 NOTE — Progress Notes (Signed)
PPD # 1 S/P VAVD  Live born female  Birth Weight: 7 lb 4.6 oz (3306 g) APGAR: 7, 8  Newborn Delivery   Birth date/time: 03/05/2020 19:07:00 Delivery type: Vaginal, Vacuum (Extractor)     Baby name: Roddie Mc Delivering provider: Olivia Mackie   Episiotomy:None   Lacerations:2nd degree   Feeding: breastfeeding, going well  Pain control at delivery: Epidural   Subjective   Reports feeling well with no complaints. Pain is well controlled with Motrin. Left leg in cast. Able to ambulate to bathroom with minimal assistance. Bleeding is light to moderate. Husband at the bedside providing support.              Tolerating po/ No nausea or vomiting             Bleeding is light, moderate             Pain controlled with ibuprofen (OTC)             Up ad lib / ambulatory / voiding without difficulties   Objective   A & O x 3, in no apparent distress              VS:  Vitals:   03/05/20 2145 03/05/20 2235 03/06/20 0229 03/06/20 0544  BP: (!) 134/93 139/86 (!) 144/90 (!) 136/97  Pulse: 66 82 66 65  Resp: 18 16 15 16   Temp: 98.9 F (37.2 C) 98.4 F (36.9 C) 98.2 F (36.8 C) 98.1 F (36.7 C)  TempSrc: Oral Axillary Oral Oral  SpO2: 98% 100% 100% 100%  Weight:      Height:        LABS:  Recent Labs    03/05/20 0728 03/06/20 0530  WBC 9.7 19.5*  HGB 12.2 9.0*  HCT 35.8* 25.5*  PLT 174 128*     Blood type: --/--/O POS (12/02 0755)  Rubella: Immune (05/11 0000)   Vaccines:   TDaP   UTD                   Flu       At discharge                             COVID-19 UTD   Gen: AAO x 3, NAD  Abdomen: soft, non-tender, non-distended             Fundus: firm, non-tender, U-1  Perineum: repair intact  Lochia: moderate  Extremities: no edema, no calf pain or tenderness   Assessment/Plan PPD # 1 34 y.o., G1P1001  Principal Problem:   Postpartum care following vaginal delivery 12/2  Doing well - stable status  Routine post partum orders  Breastfeeding support  prn Active Problems:   Closed fracture of left distal fibula  Continue supportive measures and assistance with ambulation   Encounter for induction of labor   Vacuum-assisted vaginal delivery 12/2   Second degree perineal laceration  Discussed perineal care and comfort measures.   Maternal anemia, with delivery  Start PO Niferex and mag oxide   Gestational hypertension  BPs 130-140/80-90s  Denies PEC symptoms including headache, epigastric pain, and visual changes  Start Procardia 30mg  XL PO   Continue to monitor BP closely  Close PP f/u for blood pressure   Elevated serum creatinine  Repeat CMP today at 1700  Anticipate discharge tomorrow.   14/2, MSN, CNM 03/06/2020, 10:07 AM

## 2020-03-06 NOTE — Social Work (Signed)
MOB was referred for history of depression.   * Referral screened out by Clinical Social Worker because none of the following criteria appear to apply:  ~ History of anxiety/depression during this pregnancy, or of post-partum depression following prior delivery. ~ Diagnosis of anxiety and/or depression within last 3 years. Per chart review, it is noted MOB diagnosed with depression at or prior to 2015.  OR * MOB's symptoms currently being treated with medication and/or therapy.  Please contact the Clinical Social Worker if needs arise, by [facility] request, or if MOB scores greater than 9/yes to question 10 on Edinburgh Postpartum Depression Screen.  Manfred Arch, LCSWA Clinical Social Work [employer] and [employer]  [phone]

## 2020-03-06 NOTE — Anesthesia Postprocedure Evaluation (Signed)
Anesthesia Post Note  Patient: Russella Dar Pinch  Procedure(s) Performed: AN AD HOC LABOR EPIDURAL     Patient location during evaluation: Mother Baby Anesthesia Type: Epidural Level of consciousness: awake and alert Pain management: pain level controlled Vital Signs Assessment: post-procedure vital signs reviewed and stable Respiratory status: spontaneous breathing Cardiovascular status: stable Postop Assessment: no headache, adequate PO intake, no backache, patient able to bend at knees, able to ambulate, epidural receding and no apparent nausea or vomiting Anesthetic complications: no   No complications documented.  Last Vitals:  Vitals:   03/06/20 0229 03/06/20 0544  BP: (!) 144/90 (!) 136/97  Pulse: 66 65  Resp: 15 16  Temp: 36.8 C 36.7 C  SpO2: 100% 100%    Last Pain:  Vitals:   03/06/20 0544  TempSrc: Oral  PainSc:    Pain Goal: Patients Stated Pain Goal: 0 (03/05/20 1430)                 Jennye Moccasin, Weldon Picking

## 2020-03-07 LAB — CBC WITH DIFFERENTIAL/PLATELET
Abs Immature Granulocytes: 0.15 10*3/uL — ABNORMAL HIGH (ref 0.00–0.07)
Basophils Absolute: 0 10*3/uL (ref 0.0–0.1)
Basophils Relative: 0 %
Eosinophils Absolute: 0 10*3/uL (ref 0.0–0.5)
Eosinophils Relative: 0 %
HCT: 26.1 % — ABNORMAL LOW (ref 36.0–46.0)
Hemoglobin: 8.9 g/dL — ABNORMAL LOW (ref 12.0–15.0)
Immature Granulocytes: 1 %
Lymphocytes Relative: 17 %
Lymphs Abs: 2.3 10*3/uL (ref 0.7–4.0)
MCH: 30.3 pg (ref 26.0–34.0)
MCHC: 34.1 g/dL (ref 30.0–36.0)
MCV: 88.8 fL (ref 80.0–100.0)
Monocytes Absolute: 0.6 10*3/uL (ref 0.1–1.0)
Monocytes Relative: 5 %
Neutro Abs: 10.6 10*3/uL — ABNORMAL HIGH (ref 1.7–7.7)
Neutrophils Relative %: 77 %
Platelets: 141 10*3/uL — ABNORMAL LOW (ref 150–400)
RBC: 2.94 MIL/uL — ABNORMAL LOW (ref 3.87–5.11)
RDW: 12.3 % (ref 11.5–15.5)
WBC: 13.7 10*3/uL — ABNORMAL HIGH (ref 4.0–10.5)
nRBC: 0 % (ref 0.0–0.2)

## 2020-03-07 LAB — COMPREHENSIVE METABOLIC PANEL
ALT: 20 U/L (ref 0–44)
AST: 47 U/L — ABNORMAL HIGH (ref 15–41)
Albumin: 2.2 g/dL — ABNORMAL LOW (ref 3.5–5.0)
Alkaline Phosphatase: 136 U/L — ABNORMAL HIGH (ref 38–126)
Anion gap: 8 (ref 5–15)
BUN: 11 mg/dL (ref 6–20)
CO2: 25 mmol/L (ref 22–32)
Calcium: 8.5 mg/dL — ABNORMAL LOW (ref 8.9–10.3)
Chloride: 107 mmol/L (ref 98–111)
Creatinine, Ser: 1.01 mg/dL — ABNORMAL HIGH (ref 0.44–1.00)
GFR, Estimated: >60 mL/min (ref >60)
Glucose, Bld: 88 mg/dL (ref 70–99)
Potassium: 3.9 mmol/L (ref 3.5–5.1)
Sodium: 140 mmol/L (ref 135–145)
Total Bilirubin: 0.3 mg/dL (ref 0.3–1.2)
Total Protein: 5 g/dL — ABNORMAL LOW (ref 6.5–8.1)

## 2020-03-07 MED ORDER — SENNOSIDES-DOCUSATE SODIUM 8.6-50 MG PO TABS: 2.0000 | ORAL_TABLET | Freq: Every day | ORAL | 1 refills | Status: AC

## 2020-03-07 MED ORDER — ACETAMINOPHEN 500 MG PO TABS: 1000.0000 mg | ORAL_TABLET | Freq: Four times a day (QID) | ORAL | 1 refills | Status: AC | PRN

## 2020-03-07 MED ORDER — IBUPROFEN 600 MG PO TABS: 600.0000 mg | ORAL_TABLET | Freq: Four times a day (QID) | ORAL | 0 refills | Status: AC

## 2020-03-07 MED ORDER — OXYCODONE-ACETAMINOPHEN 5-325 MG PO TABS
1.0000 | ORAL_TABLET | ORAL | 0 refills | Status: AC | PRN
Start: 2020-03-07 — End: ?

## 2020-03-07 MED ORDER — MAGNESIUM OXIDE 400 (241.3 MG) MG PO TABS: 400.0000 mg | ORAL_TABLET | Freq: Every day | ORAL | 1 refills | Status: AC

## 2020-03-07 MED ORDER — POLYSACCHARIDE IRON COMPLEX 150 MG PO CAPS: 150.0000 mg | ORAL_CAPSULE | Freq: Every day | ORAL | 1 refills | Status: AC

## 2020-03-07 MED ORDER — NIFEDIPINE ER 30 MG PO TB24: 30.0000 mg | ORAL_TABLET | Freq: Every day | ORAL | 1 refills | Status: AC

## 2020-03-07 NOTE — Discharge Summary (Signed)
OB Discharge Summary  Patient Name: Brandy Glover DOB: Oct 03, 1985 MRN: [id number]  Date of admission: 03/05/2020 Delivering MD: Olivia Mackie   Date of discharge: 03/07/2020  Admitting diagnosis: Pregnancy [Z34.90] Encounter for induction of labor [Z34.90] Intrauterine pregnancy: [redacted]w[redacted]d     Secondary diagnosis:Principal Problem:   Postpartum care following vaginal delivery 12/2 Active Problems:   Closed fracture of left distal fibula   Encounter for induction of labor   Vacuum-assisted vaginal delivery 12/2   Second degree perineal laceration   Maternal anemia, with delivery   Gestational hypertension   Elevated serum creatinine  Additional problems:PEC     Discharge diagnosis: Term Pregnancy Delivered                                                                     Post partum procedures:noen  Augmentation: per L&D record  Complications: None  Hospital course:  Induction of Labor With Vaginal Delivery   34 y.o. yo G1P1001 at [redacted]w[redacted]d was admitted to the hospital 03/05/2020 for induction of labor.  Indication for induction: Gestational hypertension.  Patient had an uncomplicated labor course as follows: Membrane Rupture Time/Date: 8:16 AM ,03/05/2020   Delivery Method:Vaginal, Vacuum (Extractor)  Episiotomy: None  Lacerations:  2nd degree  Details of delivery can be found in separate delivery note.  Patient had a routine postpartum course. Patient is discharged home 03/07/20.  Newborn Data: Birth date:03/05/2020  Birth time:7:07 PM  Gender:Female  Living status:Living  Apgars:7 ,8  Weight:3306 g   Physical exam  Vitals:   03/06/20 2024 03/07/20 0536 03/07/20 1031 03/07/20 1726  BP: (!) 140/99 (!) 139/99 122/86 125/84  Pulse: 73 79 78 74  Resp: 15 17 18 16   Temp: 98.6 F (37 C) 98.3 F (36.8 C) 98.5 F (36.9 C) 98.5 F (36.9 C)  TempSrc: Oral Oral Oral Oral  SpO2: 100% 98% 100% 99%  Weight:      Height:       General: alert, cooperative  and no distress Lochia: appropriate Uterine Fundus: firm Incision: N/A DVT Evaluation: No evidence of DVT seen on physical exam. Labs: Lab Results  Component Value Date   WBC 13.7 (H) 03/07/2020   HGB 8.9 (L) 03/07/2020   HCT 26.1 (L) 03/07/2020   MCV 88.8 03/07/2020   PLT 141 (L) 03/07/2020   CMP Latest Ref Rng & Units 03/07/2020  Glucose 70 - 99 mg/dL 88  BUN 6 - 20 mg/dL 11  Creatinine 14/07/2019 - 3.66 mg/dL 4.40)  Sodium 3.47(Q - 259 mmol/L 140  Potassium 3.5 - 5.1 mmol/L 3.9  Chloride 98 - 111 mmol/L 107  CO2 22 - 32 mmol/L 25  Calcium 8.9 - 10.3 mg/dL 563)  Total Protein 6.5 - 8.1 g/dL 5.0(L)  Total Bilirubin 0.3 - 1.2 mg/dL 0.3  Alkaline Phos 38 - 126 U/L 136(H)  AST 15 - 41 U/L 47(H)  ALT 0 - 44 U/L 20    Discharge instruction: per After Visit Summary and "Baby and Me Booklet".  After Visit Meds:  Allergies as of 03/07/2020      Reactions   Codeine Nausea And Vomiting   Dairycare [lactase-lactobacillus] Diarrhea      Medication List    TAKE these medications  acetaminophen 500 MG tablet Commonly known as: TYLENOL Take 2 tablets (1,000 mg total) by mouth every 6 (six) hours as needed for moderate pain (for pain scale < 4).   docusate sodium 100 MG capsule Commonly known as: COLACE Take 100 mg by mouth 2 (two) times daily.   doxylamine (Sleep) 25 MG tablet Commonly known as: UNISOM Take 25 mg by mouth at bedtime as needed.   ibuprofen 600 MG tablet Commonly known as: ADVIL Take 1 tablet (600 mg total) by mouth every 6 (six) hours.   iron polysaccharides 150 MG capsule Commonly known as: NIFEREX Take 1 capsule (150 mg total) by mouth daily. Start taking on: March 08, 2020   magnesium oxide 400 (241.3 Mg) MG tablet Commonly known as: MAG-OX Take 1 tablet (400 mg total) by mouth daily. Start taking on: March 08, 2020   multivitamin-prenatal 27-0.8 MG Tabs tablet Take 1 tablet by mouth daily at 12 noon.   NIFEdipine 30 MG 24 hr tablet Commonly  known as: ADALAT CC Take 1 tablet (30 mg total) by mouth daily. Start taking on: March 08, 2020   oxyCODONE-acetaminophen 5-325 MG tablet Commonly known as: PERCOCET/ROXICET Take 1-2 tablets by mouth every 4 (four) hours as needed (pain scale 4-7).   senna-docusate 8.6-50 MG tablet Commonly known as: Senokot-S Take 2 tablets by mouth at bedtime.       Diet: routine diet  Activity: Advance as tolerated. Pelvic rest for 6 weeks.   Outpatient follow up:2-3 days Follow up Appt: Future Appointments  Date Time Provider Department Center  03/18/2020  8:30 AM Eldred Manges, MD [facility] None   Follow up visit: No follow-ups on file.  Postpartum contraception: Not Discussed  Newborn Data: Live born female  Birth Weight: 7 lb 4.6 oz (3306 g) APGAR: 7, 8  Newborn Delivery   Birth date/time: 03/05/2020 19:07:00 Delivery type: Vaginal, Vacuum (Extractor)      Baby Feeding: Breast Disposition:home with mother   03/07/2020 Lendon Colonel, MD

## 2020-03-07 NOTE — Lactation Note (Signed)
This note was copied from a baby's chart. Lactation Consultation Note  Patient Name: Brandy Glover [id number] Date: 03/07/2020 Reason for consult: Follow-up assessment   P1, Baby 38 hours old.  7% weight loss.  3 voids/4 stools in the last 24 hours. Mother denies questions or concerns. Observed latch with intermittent swallows w/ some positional guidance. Feed on demand with cues.  Goal 8-12+ times per day after first 24 hrs.  Place baby STS if not cueing.  Reviewed engorgement care and monitoring voids/stools.    Maternal Data    Feeding Feeding Type: Breast Fed  LATCH Score Latch: Grasps breast easily, tongue down, lips flanged, rhythmical sucking.  Audible Swallowing: A few with stimulation  Type of Nipple: Everted at rest and after stimulation  Comfort (Breast/Nipple): Filling, red/small blisters or bruises, mild/mod discomfort  Hold (Positioning): No assistance needed to correctly position infant at breast.  LATCH Score: 8  Interventions Interventions: Breast feeding basics reviewed;Comfort gels;Coconut oil  Lactation Tools Discussed/Used     Consult Status Consult Status: Complete Date: 03/07/20    Dahlia Byes [facility] 03/07/2020, 9:14 AM

## 2020-03-07 NOTE — Progress Notes (Signed)
Post Partum Day 1 S/P vacuum extraction  Feeding: breast Subjective: Mild HA this am, improved after eating. No  SOB, CP, F/C, breast symptoms. No RUQ pain, no scotomata.  Normal vaginal bleeding, no clots. Pain controlled.  ambulating without symptoms.  voiding without difficulty    Objective: BP 122/86 (BP Location: Right Arm)   Pulse 78   Temp 98.5 F (36.9 C) (Oral)   Resp 18   Ht 5' 6.5" (1.689 m)   Wt 84.1 kg   SpO2 100%   Breastfeeding Unknown   BMI 29.48 kg/m  I&O reviewed.  Vitals:   03/06/20 1446 03/06/20 2024 03/07/20 0536 03/07/20 1031  BP: 128/84 (!) 140/99 (!) 139/99 122/86  Pulse: 61 73 79 78  Resp: 16 15 17 18   Temp: 98.2 F (36.8 C) 98.6 F (37 C) 98.3 F (36.8 C) 98.5 F (36.9 C)  TempSrc: Oral Oral Oral Oral  SpO2: 100% 100% 98% 100%  Weight:      Height:         Physical Exam:  General: alert, cooperative and no distress Lochia: appropriate Uterine Fundus: firm No right upper quadrant pain DVT Evaluation: No evidence of DVT seen on physical exam, no lower extremity edema. Ext: No c/c/e Recent Labs    03/06/20 0530 03/07/20 1202  HGB 9.0* 8.9*  HCT 25.5* 26.1*   CBC Latest Ref Rng & Units 03/07/2020 03/06/2020 03/05/2020  WBC 4.0 - 10.5 K/uL 13.7(H) 19.5(H) 9.7  Hemoglobin 12.0 - 15.0 g/dL 14/05/2019) 2.9(N) 9.8(X  Hematocrit 36 - 46 % 26.1(L) 25.5(L) 35.8(L)  Platelets 150 - 400 K/uL 141(L) 128(L) 174   CMP Latest Ref Rng & Units 03/07/2020 03/06/2020 03/05/2020  Glucose 70 - 99 mg/dL 88 84 94  BUN 6 - 20 mg/dL 11 13 15   Creatinine 0.44 - 1.00 mg/dL 14/05/2019) ) 9.41(D)  Sodium 135 - 145 mmol/L 140 137 135  Potassium 3.5 - 5.1 mmol/L 3.9 4.4 4.3  Chloride 98 - 111 mmol/L 107 105 104  CO2 22 - 32 mmol/L 25 24 20(L)  Calcium 8.9 - 10.3 mg/dL 4.08(X) 4.48(J) 8.5(U)  Total Protein 6.5 - 8.1 g/dL 5.0(L) 5.0(L) 5.3(L)  Total Bilirubin 0.3 - 1.2 mg/dL 0.3 0.5 0.5  Alkaline Phos 38 - 126 U/L 136(H) 140(H) 176(H)  AST 15 - 41 U/L 47(H) 48(H) 31  ALT  0 - 44 U/L 20 18 14        Assessment/Plan: Postpartum preeclampsia by elevated LFTs, decreased platelets and elevated creatinine. Blood pressures under good control since starting Procardia yesterday. Repeat labs this morning show improvement in most all labs other than ALT which has remained in the normal range. AST creatinine and platelets have all been improved. We will continue to watch patient through the day and as long as she has no signs or symptoms of preeclampsia blood pressures remain stable will be candidate to discharge later today with strict precautions and outpatient follow-up. Patient agrees 34 y.o.  PPD #2 .  normal postpartum exam Continue current postpartum care Ambulate   LOS: 2 days   9.7(W 03/07/2020 12:54 PM

## 2020-03-07 NOTE — Lactation Note (Addendum)
This note was copied from a baby's chart. Lactation Consultation Note  Patient Name: Brandy Glover [id number] Date: 03/07/2020 Reason for consult: Follow-up assessment;1st time breastfeeding;Infant weight loss   Time: 1936 P1, 48 hour female infant with high  -9% weight loss. Per mom, infant only had one void today no stools. Per mom, infant is cluster feeding and most feedings are  15-20 minutes in length. Infant had pacifier in mouth again today when LC entered the room, LC discussed with parents pacifier causes non-nutritive sucking, may mask hunger cues, nipple confusion and cause infant not to latch well at the breast. Mom latched infant on her left breast using the football hold, infant latched with depth dad help with breast compression while mom used the DEBP on her right breast while infant was feeding. Infant was still breastfeeding when LC left room after 10 minutes.  Mom will continue to BF infant by cues, 8 to 12+ times within 24 hrs, STS. LC discussed with  parents input and output using the Enjoy booklet and dehydration signs in infants and possibly  supplement due to weight loss of  ( -9%) due  decreasing in wet and voids today.  Per parents, infant has [facility] visit with Pediatrician at 10 am tomorrow morning. Mom will latch infant according to cues and pump every 3 hours for 15 minutes using her Medela DEBP at home and give infant back any EBM that is pumped. LC discussed engorgement treatment and prevention with mom.  Mom will call [facility] hotline if she has questions and concerns  regarding breastfeeding and LC reinforced Mom to attend [facility] Breastfeeding Support group ( free) that meet weekly.    Time: 1951 LC return to room, gave mom handout about engorgement treatment and prevention. Per mom, she started to see small amount of colostrum that she spoon fed to infant ( less than 2 mls) Mom's current plan: 1. Mom will breastfeed according to cues, 8 to 12+ times within 24  hours, STS 2. Mom will start supplementing infant with formula, LC discussed LEAD, after latching infant  at breast, hand out given based on infant's age/ hours of life. 3. Mom will pump every 3 hours for 15 minutes on initial  setting and give infant  back any pumped breast milk first before offering formula. 4. Mom is supplementing to help stabilize infant 's weight and increase input and output .  5. Mom will attend the [place] Breastfeeding support group on Tuesday within the local Community.      Maternal Data    Feeding Feeding Type: Breast Fed  LATCH Score Latch: Grasps breast easily, tongue down, lips flanged, rhythmical sucking.  Audible Swallowing: Spontaneous and intermittent  Type of Nipple: Everted at rest and after stimulation  Comfort (Breast/Nipple): Filling, red/small blisters or bruises, mild/mod discomfort  Hold (Positioning): Assistance needed to correctly position infant at breast and maintain latch.  LATCH Score: 8  Interventions Interventions: Assisted with latch;Skin to skin;Breast massage;Hand express;Breast compression;Adjust position;Support pillows;Position options;Expressed milk;Coconut oil  Lactation Tools Discussed/Used Pump Review: Setup, frequency, and cleaning;Milk Storage Initiated by:: Danelle Earthly Date initiated:: 03/07/20   Consult Status Consult Status: Complete Date: 03/07/20 Follow-up type: Physician    Danelle Earthly 03/07/2020, 7:37 PM

## 2020-03-18 ENCOUNTER — Other Ambulatory Visit: Payer: Self-pay

## 2020-03-18 ENCOUNTER — Ambulatory Visit (INDEPENDENT_AMBULATORY_CARE_PROVIDER_SITE_OTHER): Payer: PRIVATE HEALTH INSURANCE

## 2020-03-18 ENCOUNTER — Encounter: Payer: Self-pay | Admitting: Orthopaedic Surgery

## 2020-03-18 ENCOUNTER — Ambulatory Visit (INDEPENDENT_AMBULATORY_CARE_PROVIDER_SITE_OTHER): Payer: PRIVATE HEALTH INSURANCE | Admitting: Orthopaedic Surgery

## 2020-03-18 VITALS — BP 142/92 | HR 80

## 2020-03-18 DIAGNOSIS — S82832A Other fracture of upper and lower end of left fibula, initial encounter for closed fracture: Secondary | ICD-10-CM

## 2020-03-18 NOTE — Progress Notes (Signed)
Office Visit Note   Patient: Brandy Glover           Date of Birth: 1985/07/26           MRN: [id number] Visit Date: 03/18/2020              Requested by: No referring provider defined for this encounter. PCP: Patient, No Pcp Per   Assessment & Plan: Visit Diagnoses:  1. Closed fracture of distal end of left fibula, unspecified fracture morphology, initial encounter     Plan: Cam boot.  She can start restriction of weightbearing and progress as tolerated.  Return 3 weeks for likely final x-rays.  Follow-Up Instructions: Return in about 3 weeks (around 04/08/2020).   Orders:  Orders Placed This Encounter  Procedures  . XR Ankle Complete Left   No orders of the defined types were placed in this encounter.     Procedures: No procedures performed   Clinical Data: No additional findings.   Subjective: Chief Complaint  Patient presents with  . Left Ankle - Fracture, Follow-up    DOI 02/08/2020    HPI 34 year old female returns post left closed lateral malleolar fracture.  She delivered her baby girl who is doing well.  Cast removed today x-rays are obtained.  Review of Systems updated, pregnancy was induced due to hypertension.  She had some post delivery eclampsia.     Objective: Vital Signs: There were no vitals taken for this visit.  Physical Exam Constitutional:      Appearance: She is well-developed.  HENT:     Head: Normocephalic.     Right Ear: External ear normal.     Left Ear: External ear normal.  Eyes:     Pupils: Pupils are equal, round, and reactive to light.  Neck:     Thyroid: No thyromegaly.     Trachea: No tracheal deviation.  Cardiovascular:     Rate and Rhythm: Normal rate.  Pulmonary:     Effort: Pulmonary effort is normal.  Abdominal:     Palpations: Abdomen is soft.  Skin:    General: Skin is warm and dry.  Neurological:     Mental Status: She is alert and oriented to person, place, and time.  Psychiatric:         Mood and Affect: Mood and affect normal.        Behavior: Behavior normal.     Ortho Exam minimal tenderness lateral malleolus.  Trace ankle swelling.  Pulses are intact sensation is intact left ankle.  Specialty Comments:  No specialty comments available.  Imaging: XR Ankle Complete Left  Result Date: 03/18/2020 Three-view x-rays left ankle obtained and reviewed.  Oblique fibular fracture seen with callus formation.  Midportion of the fracture line still visualized.  No shifting of the mortise.  Minimally loss is intact. Impression: Left lateral malleolar fracture with interval healing.    PMFS History: Patient Active Problem List   Diagnosis Date Noted  . Vacuum-assisted vaginal delivery 12/2 03/06/2020  . Second degree perineal laceration 03/06/2020  . Postpartum care following vaginal delivery 12/2 03/06/2020  . Maternal anemia, with delivery 03/06/2020  . Gestational hypertension 03/06/2020  . Elevated serum creatinine 03/06/2020  . Encounter for induction of labor 03/05/2020  . Closed fracture of left distal fibula 02/12/2020   Past Medical History:  Diagnosis Date  . Anemia   . Broken ankle 02/08/2020   left side  . Depression    history of no meds since 2015  .  Hypothyroidism    through college for 6 years-no meds since 2015    Family History  Problem Relation Age of Onset  . Hypertension Father   . Cancer Maternal Aunt   . Cancer Maternal Grandmother   . Parkinson's disease Paternal Grandfather     No past surgical history on file. Social History   Occupational History  . Not on file  Tobacco Use  . Smoking status: Never Smoker  . Smokeless tobacco: Never Used  Vaping Use  . Vaping Use: Never used  Substance and Sexual Activity  . Alcohol use: Never  . Drug use: Never  . Sexual activity: Yes

## 2020-04-08 ENCOUNTER — Encounter: Payer: Self-pay | Admitting: Orthopaedic Surgery

## 2020-04-08 ENCOUNTER — Ambulatory Visit (INDEPENDENT_AMBULATORY_CARE_PROVIDER_SITE_OTHER): Payer: PRIVATE HEALTH INSURANCE | Admitting: Orthopaedic Surgery

## 2020-04-08 ENCOUNTER — Ambulatory Visit (INDEPENDENT_AMBULATORY_CARE_PROVIDER_SITE_OTHER): Payer: PRIVATE HEALTH INSURANCE

## 2020-04-08 VITALS — BP 118/77 | HR 69

## 2020-04-08 DIAGNOSIS — S82832A Other fracture of upper and lower end of left fibula, initial encounter for closed fracture: Secondary | ICD-10-CM

## 2020-04-08 NOTE — Progress Notes (Signed)
Office Visit Note   Patient: Brandy Glover           Date of Birth: 1985/06/01           MRN: [id number] Visit Date: 04/08/2020              Requested by: No referring provider defined for this encounter. PCP: Patient, No Pcp Per   Assessment & Plan: Visit Diagnoses:  1. Closed fracture of distal end of left fibula, unspecified fracture morphology, initial encounter     Plan: X-rays look good she can ambulate with the shoe with only trace limp no pain.  No tenderness of the fracture site.  We discussed gradual ramping up activities.  Follow-Up Instructions: Return if symptoms worsen or fail to improve.   Orders:  Orders Placed This Encounter  Procedures  . XR Ankle Complete Left   No orders of the defined types were placed in this encounter.     Procedures: No procedures performed   Clinical Data: No additional findings.   Subjective: Chief Complaint  Patient presents with  . Left Ankle - Follow-up, Fracture    DOI 02/08/2020    HPI follow-up lateral malleolar fracture left ankle.  Patient been in a cam boot and weightbearing as tolerated.  Review of Systems Recent vaginal delivery 03/05/2020 otherwise negative.  Positive for gestational hypertension.  She not been released for work out by her OB/GYN doctor .  Objective: Vital Signs: BP 118/77   Pulse 69   Physical Exam Constitutional:      Appearance: She is well-developed.  HENT:     Head: Normocephalic.     Right Ear: External ear normal.     Left Ear: External ear normal.  Eyes:     Pupils: Pupils are equal, round, and reactive to light.  Neck:     Thyroid: No thyromegaly.     Trachea: No tracheal deviation.  Cardiovascular:     Rate and Rhythm: Normal rate.  Pulmonary:     Effort: Pulmonary effort is normal.  Abdominal:     Palpations: Abdomen is soft.  Skin:    General: Skin is warm and dry.  Neurological:     Mental Status: She is alert and oriented to person,  place, and time.  Psychiatric:        Mood and Affect: Mood and affect normal.        Behavior: Behavior normal.     Ortho Exam no ankle swelling.  She is amatory with a trace limp.  She has 75% ankle range of motion out of the cam boot today.  Specialty Comments:  No specialty comments available.  Imaging: XR Ankle Complete Left  Result Date: 04/08/2020 Three-view x-rays left ankle obtained and reviewed.  This shows healing of the nondisplaced lateral malleolar fracture.  Medial clear space is anatomic. Impression: Healed left ankle lateral malleolar fracture    PMFS History: Patient Active Problem List   Diagnosis Date Noted  . Vacuum-assisted vaginal delivery 12/2 03/06/2020  . Second degree perineal laceration 03/06/2020  . Postpartum care following vaginal delivery 12/2 03/06/2020  . Maternal anemia, with delivery 03/06/2020  . Gestational hypertension 03/06/2020  . Elevated serum creatinine 03/06/2020  . Encounter for induction of labor 03/05/2020  . Closed fracture of left distal fibula 02/12/2020   Past Medical History:  Diagnosis Date  . Anemia   . Broken ankle 02/08/2020   left side  . Depression    history of no meds  since 2015  . Hypothyroidism    through college for 6 years-no meds since 2015    Family History  Problem Relation Age of Onset  . Hypertension Father   . Cancer Maternal Aunt   . Cancer Maternal Grandmother   . Parkinson's disease Paternal Grandfather     No past surgical history on file. Social History   Occupational History  . Not on file  Tobacco Use  . Smoking status: Never Smoker  . Smokeless tobacco: Never Used  Vaping Use  . Vaping Use: Never used  Substance and Sexual Activity  . Alcohol use: Never  . Drug use: Never  . Sexual activity: Yes
